# Patient Record
Sex: Male | Born: 1995
Health system: Southern US, Community
[De-identification: ages and names within clinical notes are randomized; demographics above are authoritative.]

## PROBLEM LIST (undated history)

## (undated) DIAGNOSIS — R091 Pleurisy: Secondary | ICD-10-CM

---

## 2014-03-08 ENCOUNTER — Encounter (HOSPITAL_BASED_OUTPATIENT_CLINIC_OR_DEPARTMENT_OTHER): Payer: Self-pay | Admitting: Emergency Medicine

## 2014-03-08 ENCOUNTER — Emergency Department (HOSPITAL_BASED_OUTPATIENT_CLINIC_OR_DEPARTMENT_OTHER)
Admission: EM | Admit: 2014-03-08 | Discharge: 2014-03-08 | Disposition: A | Discharge status: Home / Self Care | Payer: Self-pay | Attending: Emergency Medicine | Admitting: Emergency Medicine

## 2014-03-08 ENCOUNTER — Emergency Department (HOSPITAL_BASED_OUTPATIENT_CLINIC_OR_DEPARTMENT_OTHER): Payer: Self-pay

## 2014-03-08 DIAGNOSIS — R091 Pleurisy: Secondary | ICD-10-CM | POA: Insufficient documentation

## 2014-03-08 LAB — TROPONIN I: Troponin I: <0.03 ng/mL (ref <0.031)

## 2014-03-08 MED ORDER — IBUPROFEN 600 MG PO TABS: 600.0000 mg | ORAL_TABLET | Freq: Four times a day (QID) | ORAL | Status: DC | PRN

## 2014-03-08 NOTE — ED Notes (Addendum)
Non specific intermittent chest pain for over two months.  Some sob today. Along with diaphoresis and lightheadedness.  No recent travel.  No acute distress noted.

## 2014-03-08 NOTE — Discharge Instructions (Signed)

## 2014-03-08 NOTE — ED Notes (Signed)
Pt c/o chest pain x 2 months- HR 75, O2 sats 100% on RA checked by Eliseo GumBenton, RN- [facility]

## 2014-03-08 NOTE — ED Provider Notes (Signed)
CSN: [id number]     Arrival date & time 03/08/14  1521 History   First MD Initiated Contact with Patient 03/08/14 1536     Chief Complaint  Patient presents with  . Chest Pain      HPI Patient comes in with 2 month history of intermittent chest pain.  Chest pain has been pleuritic in nature.  Comes and goes.  No diaphoresis, fever, chills.  No recent travel.  No leg swelling.  Patient at work when pain was there today. No past medical history on file. No past surgical history on file. No family history on file. History  Substance Use Topics  . Smoking status: Never Smoker   . Smokeless tobacco: Not on file  . Alcohol Use: Not on file    Review of Systems  All other systems reviewed and are negative  Allergies  Review of patient's allergies indicates no known allergies.  Home Medications   Prior to Admission medications   Medication Sig Start Date End Date Taking? Authorizing Provider  ibuprofen (ADVIL,MOTRIN) 600 MG tablet Take 1 tablet (600 mg total) by mouth every 6 (six) hours as needed. 03/08/14   Nelia Shiobert L Alis Sawchuk, MD   BP 147/79 mmHg  Pulse 73  Temp(Src) 98.5 F (36.9 C) (Oral)  Resp 18  Ht 5\' 8"  (1.727 m)  Wt 175 lb (79.379 kg)  BMI 26.61 kg/m2  SpO2 100% Physical Exam Physical Exam  Nursing note and vitals reviewed. Constitutional: He is oriented to person, place, and time. He appears well-developed and well-nourished. No distress.  HENT:  Head: Normocephalic and atraumatic.  Eyes: Pupils are equal, round, and reactive to light.  Neck: Normal range of motion.  Cardiovascular: Normal rate and intact distal pulses.   Pulmonary/Chest: No respiratory distress.  Abdominal: Normal appearance. He exhibits no distension.  Musculoskeletal: Normal range of motion.  Neurological: He is alert and oriented to person, place, and time. No cranial nerve deficit.  Skin: Skin is warm and dry. No rash noted.  Psychiatric: He has a normal mood and affect. His behavior is  normal.   ED Course  Procedures (including critical care time) Labs Review Labs Reviewed  TROPONIN I    Imaging Review Dg Chest 2 View  03/08/2014   CLINICAL DATA:  Chest pain for approximately 2 months  EXAM: CHEST  2 VIEW  COMPARISON:  None.  FINDINGS: Lungs are clear. Heart size and pulmonary vascularity are normal. No adenopathy. No bone lesions. There is thoracolumbar dextroscoliosis.  IMPRESSION: No edema or consolidation.   Electronically Signed   By: Bretta BangWilliam  Woodruff M.D.   On: 03/08/2014 15:50     EKG Interpretation   Date/Time:  Wednesday March 08 2014 15:27:27 EST Ventricular Rate:  78 PR Interval:  168 QRS Duration: 90 QT Interval:  374 QTC Calculation: 426 R Axis:   24 Text Interpretation:  Normal sinus rhythm with sinus arrhythmia Normal ECG  Confirmed by Radford PaxBEATON  MD, Lovell Nuttall ([id number]) on 03/08/2014 4:05:11 PM      MDM   Final diagnoses:  Pleurisy        Nelia Shiobert L Lindell Renfrew, MD 03/08/14 [id number]

## 2014-10-13 ENCOUNTER — Encounter (HOSPITAL_BASED_OUTPATIENT_CLINIC_OR_DEPARTMENT_OTHER): Payer: Self-pay | Admitting: *Deleted

## 2014-10-13 ENCOUNTER — Emergency Department (HOSPITAL_BASED_OUTPATIENT_CLINIC_OR_DEPARTMENT_OTHER)
Admission: EM | Admit: 2014-10-13 | Discharge: 2014-10-13 | Disposition: A | Discharge status: Home / Self Care | Payer: Self-pay | Attending: Emergency Medicine | Admitting: Emergency Medicine

## 2014-10-13 DIAGNOSIS — R110 Nausea: Secondary | ICD-10-CM | POA: Insufficient documentation

## 2014-10-13 DIAGNOSIS — R42 Dizziness and giddiness: Secondary | ICD-10-CM | POA: Insufficient documentation

## 2014-10-13 MED ORDER — MECLIZINE HCL 25 MG PO TABS: 25.0000 mg | ORAL_TABLET | Freq: Three times a day (TID) | ORAL | Status: DC | PRN

## 2014-10-13 MED ORDER — MECLIZINE HCL 25 MG PO TABS
25.0000 mg | ORAL_TABLET | Freq: Once | ORAL | Status: AC
  Administered 2014-10-13: 25 mg via ORAL
  Filled 2014-10-13: qty 1

## 2014-10-13 NOTE — Discharge Instructions (Signed)

## 2014-10-13 NOTE — ED Notes (Signed)
Pt amb to triage with quick steady gait in nad. Pt reports nausea and intermittent dizzy feeling x 3 days. Pt states 'I thought I was fine, but then I looked on google and got scared!" pt denies any fevers, vomiting or diarrhea, last bm just pta, normal.

## 2014-10-13 NOTE — ED Provider Notes (Signed)
CSN: [id number]     Arrival date & time 10/13/14  1109 History   First MD Initiated Contact with Patient 10/13/14 1138     Chief Complaint  Patient presents with  . Abdominal Pain     (Consider location/radiation/quality/duration/timing/severity/associated sxs/prior Treatment) HPI Comments: Patient presents with dizziness. He states over the last 3 days he's had a spinning sensation. He states it's not related to head movement. It's not related to standing. He's had some associated nausea but no vomiting. He states it's fairly mild right now. He denies any headache. He denies any neck pain. He denies any abdominal pain. He denies any URI symptoms. He denies any numbness or weakness to his extremities. He denies any recent head injuries.  Patient is a 19 y.o. male presenting with abdominal pain.  Abdominal Pain Associated symptoms: nausea   Associated symptoms: no chest pain, no chills, no cough, no diarrhea, no fatigue, no fever, no hematuria, no shortness of breath and no vomiting     History reviewed. No pertinent past medical history. History reviewed. No pertinent past surgical history. History reviewed. No pertinent family history. History  Substance Use Topics  . Smoking status: Never Smoker   . Smokeless tobacco: Not on file  . Alcohol Use: Not on file    Review of Systems  Constitutional: Negative for fever, chills, diaphoresis and fatigue.  HENT: Negative for congestion, rhinorrhea and sneezing.   Eyes: Negative.   Respiratory: Negative for cough, chest tightness and shortness of breath.   Cardiovascular: Negative for chest pain and leg swelling.  Gastrointestinal: Positive for nausea. Negative for vomiting, abdominal pain, diarrhea and blood in stool.  Genitourinary: Negative for frequency, hematuria, flank pain and difficulty urinating.  Musculoskeletal: Negative for back pain and arthralgias.  Skin: Negative for rash.  Neurological: Positive for dizziness. Negative  for speech difficulty, weakness, numbness and headaches.      Allergies  Review of patient's allergies indicates no known allergies.  Home Medications   Prior to Admission medications   Medication Sig Start Date End Date Taking? Authorizing Provider  ibuprofen (ADVIL,MOTRIN) 600 MG tablet Take 1 tablet (600 mg total) by mouth every 6 (six) hours as needed. 03/08/14   Nelva Nay, MD  meclizine (ANTIVERT) 25 MG tablet Take 1 tablet (25 mg total) by mouth 3 (three) times daily as needed for dizziness. 10/13/14   Rolan Bucco, MD   BP 128/77 mmHg  Pulse 107  Temp(Src) 98 F (36.7 C) (Oral)  Resp 18  Ht  (1.727 m)  Wt 176 lb (79.833 kg)  BMI 26.77 kg/m2  SpO2 99% Physical Exam  Constitutional: He is oriented to person, place, and time. He appears well-developed and well-nourished.  HENT:  Head: Normocephalic and atraumatic.  Eyes: Pupils are equal, round, and reactive to light.  Positive horizontal nystagmus, no vertical or rotational nystagmus  Neck: Normal range of motion. Neck supple.  Cardiovascular: Normal rate, regular rhythm and normal heart sounds.   Pulmonary/Chest: Effort normal and breath sounds normal. No respiratory distress. He has no wheezes. He has no rales. He exhibits no tenderness.  Abdominal: Soft. Bowel sounds are normal. There is no tenderness. There is no rebound and no guarding.  Musculoskeletal: Normal range of motion. He exhibits no edema.  Lymphadenopathy:    He has no cervical adenopathy.  Neurological: He is alert and oriented to person, place, and time.  Motor 5 out of 5 all extraneous, sensation grossly intact to light touch all extremities, finger-nose intact,  no pronator drift, gait normal. Cranial nerves II visual grossly intact.  Skin: Skin is warm and dry. No rash noted.  Psychiatric: He has a normal mood and affect.    ED Course  Procedures (including critical care time) Labs Review Labs Reviewed - No data to display  Imaging  Review No results found.   EKG Interpretation None      MDM   Final diagnoses:  Vertigo    Patient presents with vertigo symptoms. He has no ataxia, he has no other suggestions of the central etiology for the vertigo. He has no headache or neck pain which would be more suspicious for a carotid dissection. He has only very mild symptoms currently. He was discharged with a prescription for meclizine to use for symptomatic relief. He is encouraged to return if he has any worsening symptoms.    Rolan Bucco, MD 10/13/14 1300

## 2014-10-13 NOTE — ED Notes (Signed)
Patient given a gown and asked to undress and put the gown on opening in the back, Patient given a cup to obtain a urine sample if he gets the urge to use the restroom.

## 2016-12-08 ENCOUNTER — Emergency Department (HOSPITAL_BASED_OUTPATIENT_CLINIC_OR_DEPARTMENT_OTHER): Payer: 59

## 2016-12-08 ENCOUNTER — Encounter (HOSPITAL_BASED_OUTPATIENT_CLINIC_OR_DEPARTMENT_OTHER): Payer: Self-pay

## 2016-12-08 ENCOUNTER — Emergency Department (HOSPITAL_BASED_OUTPATIENT_CLINIC_OR_DEPARTMENT_OTHER)
Admission: EM | Admit: 2016-12-08 | Discharge: 2016-12-08 | Disposition: A | Discharge status: Home / Self Care | Payer: 59 | Attending: Emergency Medicine | Admitting: Emergency Medicine

## 2016-12-08 DIAGNOSIS — J039 Acute tonsillitis, unspecified: Secondary | ICD-10-CM | POA: Diagnosis not present

## 2016-12-08 DIAGNOSIS — J029 Acute pharyngitis, unspecified: Secondary | ICD-10-CM | POA: Diagnosis present

## 2016-12-08 HISTORY — DX: Pleurisy: R09.1

## 2016-12-08 LAB — RAPID STREP SCREEN (MED CTR MEBANE ONLY): Streptococcus, Group A Screen (Direct): NEGATIVE

## 2016-12-08 MED ORDER — DEXAMETHASONE 4 MG PO TABS
8.0000 mg | ORAL_TABLET | Freq: Once | ORAL | Status: AC
  Administered 2016-12-08: 8 mg via ORAL
  Filled 2016-12-08: qty 2

## 2016-12-08 MED ORDER — AMOXICILLIN-POT CLAVULANATE 875-125 MG PO TABS: 1.0000 | ORAL_TABLET | Freq: Two times a day (BID) | ORAL | 0 refills | Status: AC

## 2016-12-08 MED ORDER — IBUPROFEN 400 MG PO TABS
600.0000 mg | ORAL_TABLET | Freq: Once | ORAL | Status: AC
  Administered 2016-12-08: 600 mg via ORAL
  Filled 2016-12-08: qty 1

## 2016-12-08 MED FILL — AMOX-CLAV 875-125 MG TABLET: 875-125 | 10 days supply | Qty: 20 | Fill #0

## 2016-12-08 NOTE — ED Triage Notes (Signed)
C/o CP started yesterday-states feels same as we had pleurisy-denies cough-HA, chills, sore throat started today-NAD-steady gait

## 2016-12-08 NOTE — Discharge Instructions (Signed)
Please read instructions below.  You can take advil avery 6 hours as needed for sore throat or body aches. Begin taking the antibiotic 2 times per day until it is gone.  Drink plenty of water.  Follow up with your primary care provider in 3 days.  Return to the ER for worsening symptoms, difficulty swallowing liquids, difficulty breathing, or new or worsening symptoms.

## 2016-12-08 NOTE — ED Provider Notes (Signed)
[facility]   CSN: [id number] Arrival date & time: 12/08/16  1317     History   Chief Complaint Chief Complaint  Patient presents with  . Multiple c/o    HPI Darrell Webb is a 21 y.o. male with past medical history of pleurisy, presenting to the ED for acute onset of sore throat that began today. He states throat is worse with swallowing, no medications tried for his symptoms. He also reports associated epigastric/central chest discomfort that is worse with particular movements and generalized body aches. He denies cough, shortness of breath, difficulty breathing or swallowing, congestion or ear pain.  The history is provided by the patient.    Past Medical History:  Diagnosis Date  . Pleurisy     There are no active problems to display for this patient.   History reviewed. No pertinent surgical history.     Home Medications    Prior to Admission medications   Medication Sig Start Date End Date Taking? Authorizing Provider  amoxicillin-clavulanate (AUGMENTIN) 875-125 MG tablet Take 1 tablet by mouth every 12 (twelve) hours. 12/08/16 12/18/16  Russo, [place] N, PA-C    Family History No family history on file.  Social History Social History  Substance Use Topics  . Smoking status: Never Smoker  . Smokeless tobacco: Never Used  . Alcohol use Yes     Comment: occ     Allergies   Patient has no known allergies.   Review of Systems Review of Systems  Constitutional: Negative for chills and fever.  HENT: Positive for sore throat. Negative for congestion, ear pain, trouble swallowing and voice change.   Respiratory: Positive for chest tightness. Negative for cough and shortness of breath.   Gastrointestinal: Negative for nausea.     Physical Exam Updated Vital Signs BP 124/83 (BP Location: Left Arm)   Pulse (!) 104   Temp 100.3 F (37.9 C) (Oral)   Resp 18   Ht  (1.727 m)   Wt 81.6 kg (180 lb)   SpO2 100%   BMI  27.37 kg/m   Physical Exam  Constitutional: He appears well-developed and well-nourished. No distress.  tolerating secretions.  HENT:  Head: Normocephalic and atraumatic.  Right Ear: Hearing, external ear and ear canal normal. Tympanic membrane is erythematous.  Left Ear: Hearing, external ear and ear canal normal. Tympanic membrane is erythematous.  Nose: Nose normal.  Mouth/Throat: Uvula is midline. No trismus in the jaw. No uvula swelling. Posterior oropharyngeal erythema present.  Uvula is midline with symmetrical raise in soft palate. Right tonsil with erythema and edema, greater than left. Small amount of tonsillar exudate.  Eyes: Pupils are equal, round, and reactive to light. Conjunctivae and EOM are normal.  Neck: Normal range of motion. Neck supple. No tracheal deviation present.  Cardiovascular: Regular rhythm, normal heart sounds and intact distal pulses.   Mildly tachycardic  Pulmonary/Chest: Effort normal and breath sounds normal. No stridor. No respiratory distress. He has no wheezes. He has no rales. He exhibits tenderness.  Abdominal: Soft. Bowel sounds are normal. He exhibits no distension. There is no tenderness. There is no rebound and no guarding.  Lymphadenopathy:    He has no cervical adenopathy.  Psychiatric: He has a normal mood and affect. His behavior is normal.  Nursing note and vitals reviewed.    ED Treatments / Results  Labs (all labs ordered are listed, but only abnormal results are displayed) Labs Reviewed  RAPID STREP SCREEN (NOT AT [facility])  CULTURE, GROUP A STREP [facility])    EKG  EKG Interpretation None       Radiology Dg Chest 2 View  Result Date: 12/08/2016 CLINICAL DATA:  Headache and shortness of breath. EXAM: CHEST  2 VIEW COMPARISON:  Chest radiograph 03/08/2014. FINDINGS: Stable cardiac and mediastinal contours. No consolidative pulmonary opacities. No pleural effusion or pneumothorax. Regional skeleton is unremarkable. IMPRESSION: No  acute cardiopulmonary process. Electronically Signed   By: Annia Belt M.D.   On: 12/08/2016 14:22    Procedures Procedures (including critical care time)  Medications Ordered in ED Medications  ibuprofen (ADVIL,MOTRIN) tablet 600 mg (600 mg Oral Given 12/08/16 1425)  dexamethasone (DECADRON) tablet 8 mg (8 mg Oral Given 12/08/16 1446)     Initial Impression / Assessment and Plan / ED Course  I have reviewed the triage vital signs and the nursing notes.  Pertinent labs & imaging results that were available during my care of the patient were reviewed by me and considered in my medical decision making (see chart for details).     Patients symptoms are consistent with tonsillitis, right greater than left. Patient is afebrile, tolerating secretions. Presentation not consistent with PTA or infxn spread to soft tissue. No difficulty breathing or swallowing. No trismus or uvula deviation. CXR neg, lungs CTAB. Will discharge w empiric antibiotics and symptomatic treatment. Strep culture sent. Strict return precautions given. Pt is hemodynamically stable and in NAD prior to discharge.  Patient discussed with and seen by Dr. Rubin Payor who guided treatment and agrees with care plan.   Discussed results, findings, treatment and follow up. Patient advised of return precautions. Patient verbalized understanding and agreed with plan.  Final Clinical Impressions(s) / ED Diagnoses   Final diagnoses:  Tonsillitis    New Prescriptions Discharge Medication List as of 12/08/2016  2:57 PM    START taking these medications   Details  amoxicillin-clavulanate (AUGMENTIN) 875-125 MG tablet Take 1 tablet by mouth every 12 (twelve) hours., Starting Mon 12/08/2016, Until Thu 12/18/2016, Print         Timothy Lasso [place] N, PA-C 12/08/16 1511    Benjiman Core, MD 12/09/16 1538

## 2016-12-08 NOTE — ED Notes (Signed)
Patient reports sore throat.

## 2016-12-11 LAB — CULTURE, GROUP A STREP (THRC)

## 2018-04-01 IMAGING — DX DG CHEST 2V
2 series · 2 of 2 positions shown · non-contrast
Comparison: Chest radiograph 03/08/2014.

CLINICAL DATA: Headache and shortness of breath.

EXAM:
CHEST  2 VIEW

[chest pa]
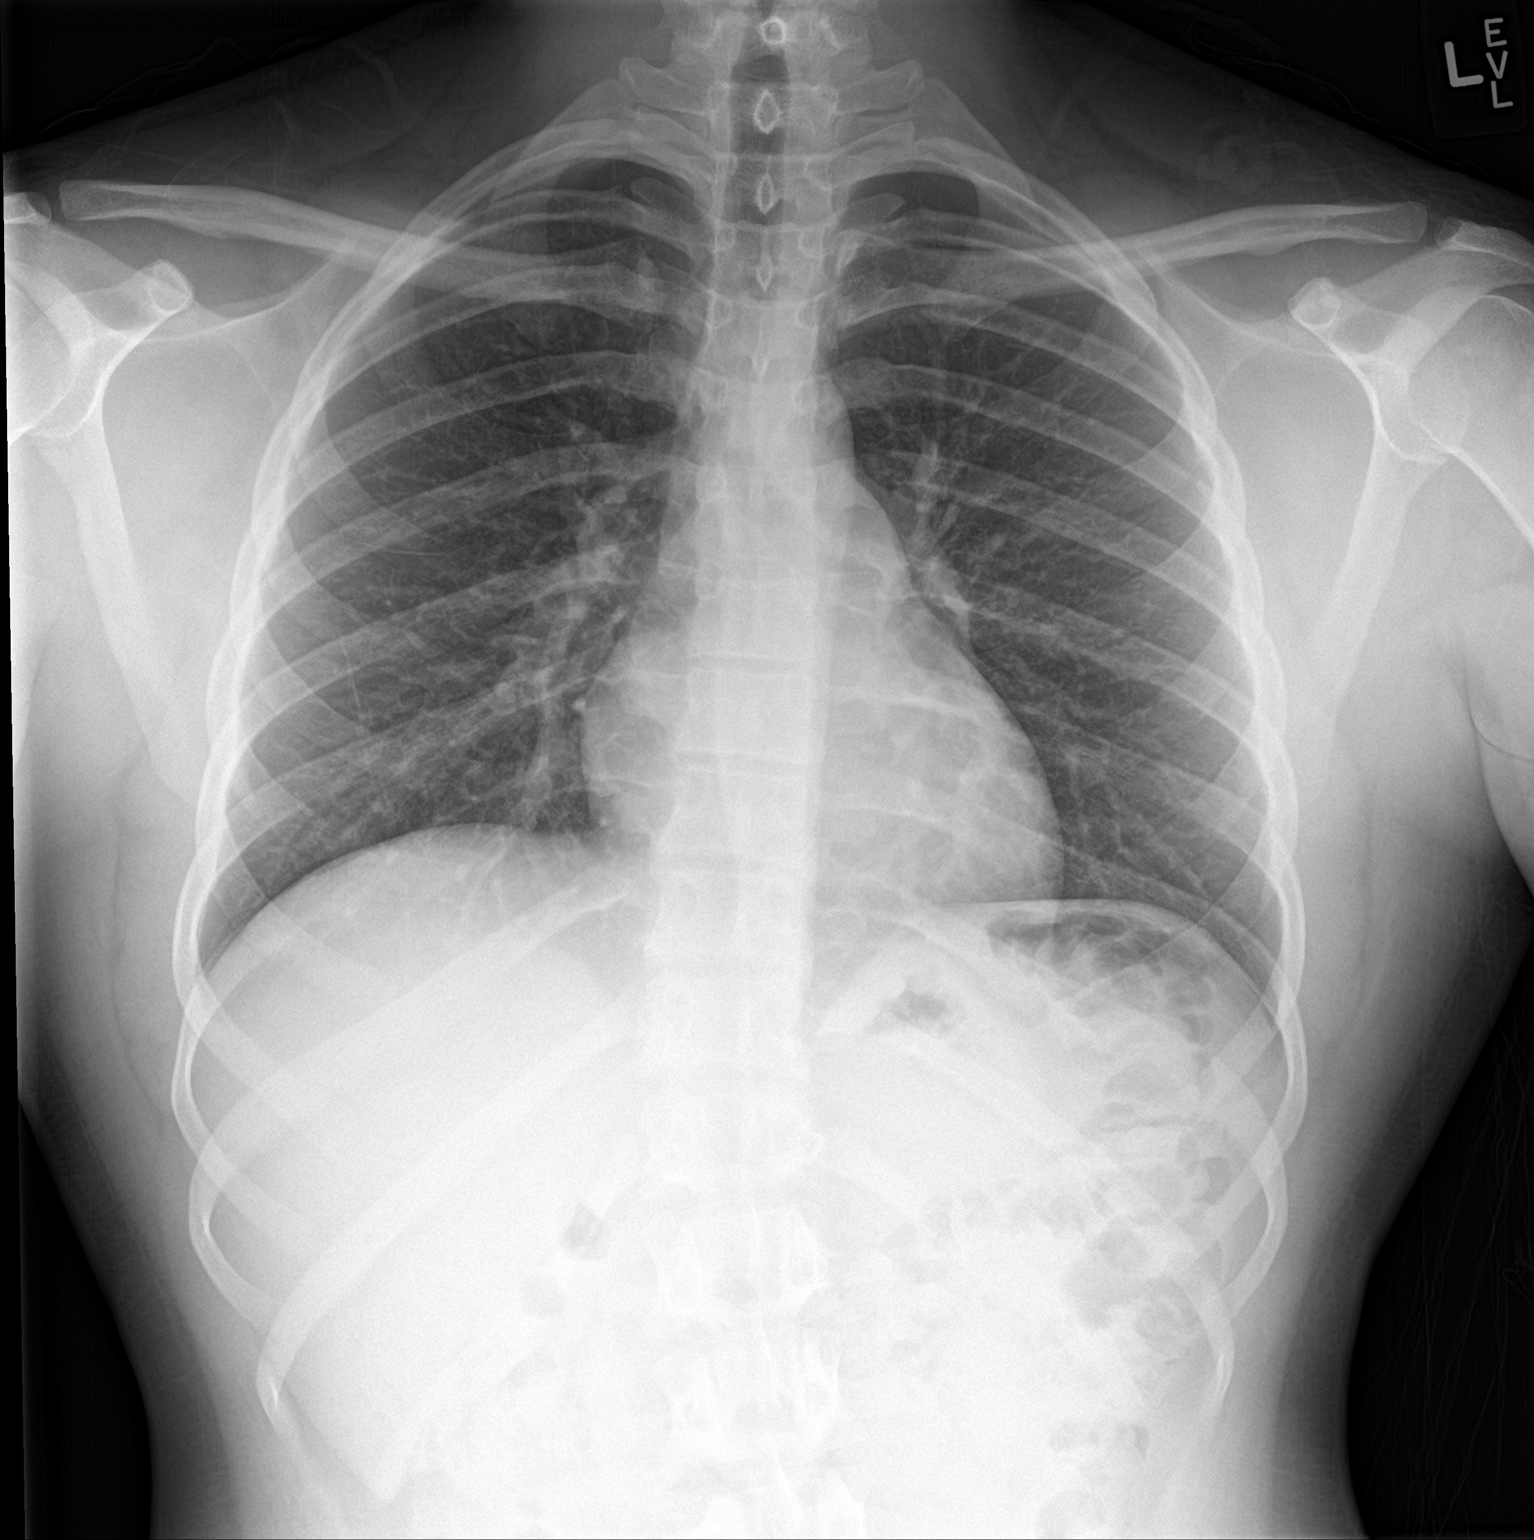

[chest lat]
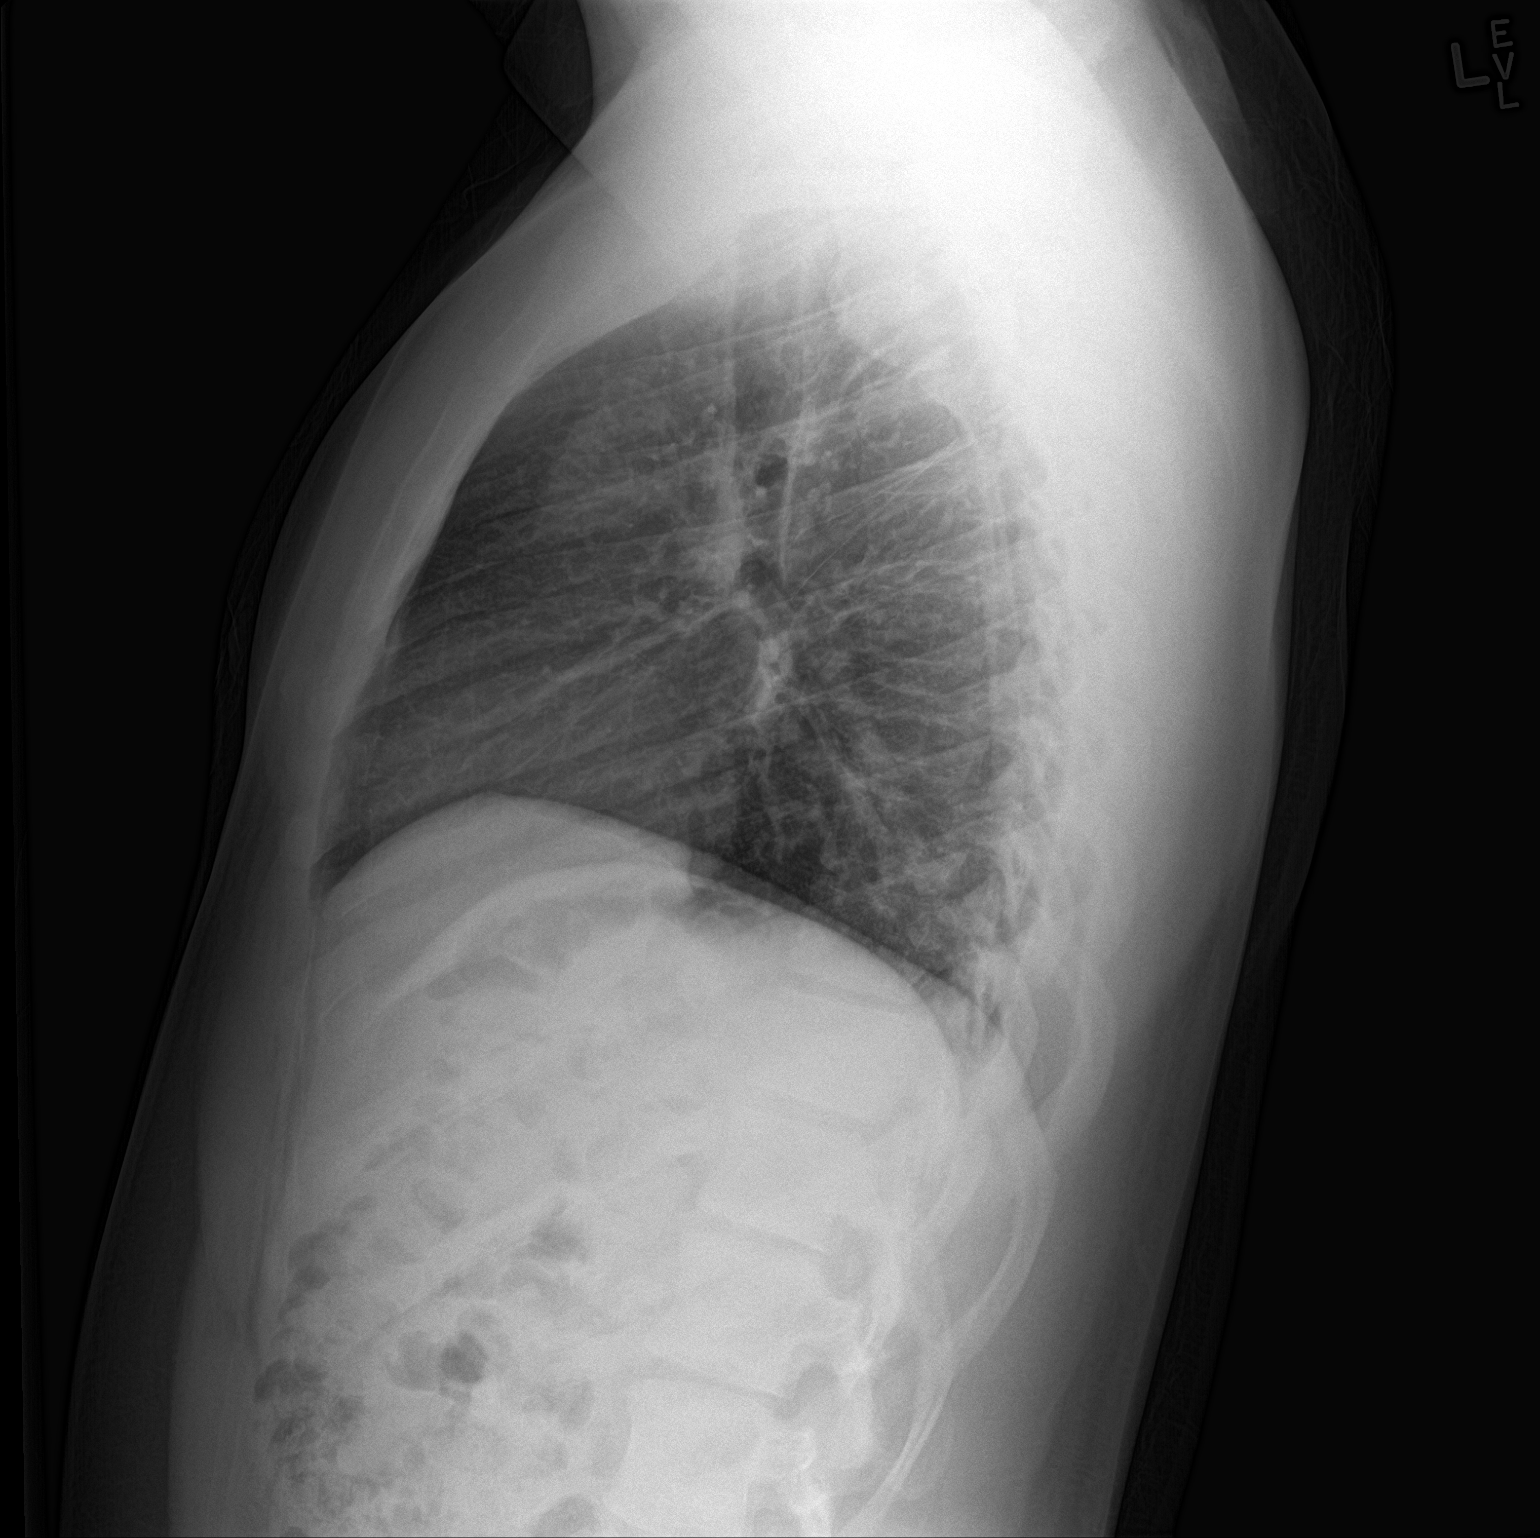

[2 of 2 positions shown; findings below may reference images not displayed]

FINDINGS: Stable cardiac and mediastinal contours. No consolidative pulmonary
opacities. No pleural effusion or pneumothorax. Regional skeleton is
unremarkable.
IMPRESSION: No acute cardiopulmonary process.

## 2019-04-21 ENCOUNTER — Emergency Department (HOSPITAL_BASED_OUTPATIENT_CLINIC_OR_DEPARTMENT_OTHER)
Admission: EM | Admit: 2019-04-21 | Discharge: 2019-04-21 | Disposition: A | Discharge status: Home / Self Care | Payer: BC Managed Care – PPO | Attending: Emergency Medicine | Admitting: Emergency Medicine

## 2019-04-21 ENCOUNTER — Other Ambulatory Visit: Payer: Self-pay

## 2019-04-21 ENCOUNTER — Encounter (HOSPITAL_BASED_OUTPATIENT_CLINIC_OR_DEPARTMENT_OTHER): Payer: Self-pay | Admitting: *Deleted

## 2019-04-21 ENCOUNTER — Emergency Department (HOSPITAL_BASED_OUTPATIENT_CLINIC_OR_DEPARTMENT_OTHER): Payer: BC Managed Care – PPO

## 2019-04-21 DIAGNOSIS — R5383 Other fatigue: Secondary | ICD-10-CM | POA: Diagnosis not present

## 2019-04-21 DIAGNOSIS — R091 Pleurisy: Secondary | ICD-10-CM | POA: Diagnosis not present

## 2019-04-21 DIAGNOSIS — R6883 Chills (without fever): Secondary | ICD-10-CM | POA: Insufficient documentation

## 2019-04-21 DIAGNOSIS — U071 COVID-19: Secondary | ICD-10-CM | POA: Insufficient documentation

## 2019-04-21 DIAGNOSIS — R0789 Other chest pain: Secondary | ICD-10-CM

## 2019-04-21 DIAGNOSIS — R079 Chest pain, unspecified: Secondary | ICD-10-CM | POA: Diagnosis present

## 2019-04-21 LAB — CBC WITH DIFFERENTIAL/PLATELET
Abs Immature Granulocytes: 0.03 10*3/uL (ref 0.00–0.07)
Basophils Absolute: 0 10*3/uL (ref 0.0–0.1)
Basophils Relative: 0 %
Eosinophils Absolute: 0.1 10*3/uL (ref 0.0–0.5)
Eosinophils Relative: 1 %
HCT: 42.1 % (ref 39.0–52.0)
Hemoglobin: 14.8 g/dL (ref 13.0–17.0)
Immature Granulocytes: 0 %
Lymphocytes Relative: 19 %
Lymphs Abs: 1.5 10*3/uL (ref 0.7–4.0)
MCH: 31.8 pg (ref 26.0–34.0)
MCHC: 35.2 g/dL (ref 30.0–36.0)
MCV: 90.3 fL (ref 80.0–100.0)
Monocytes Absolute: 1.2 10*3/uL — ABNORMAL HIGH (ref 0.1–1.0)
Monocytes Relative: 15 %
Neutro Abs: 5.1 10*3/uL (ref 1.7–7.7)
Neutrophils Relative %: 65 %
Platelets: 215 10*3/uL (ref 150–400)
RBC: 4.66 MIL/uL (ref 4.22–5.81)
RDW: 11.9 % (ref 11.5–15.5)
WBC: 8 10*3/uL (ref 4.0–10.5)
nRBC: 0 % (ref 0.0–0.2)

## 2019-04-21 LAB — COMPREHENSIVE METABOLIC PANEL
ALT: 29 U/L (ref 0–44)
AST: 25 U/L (ref 15–41)
Albumin: 4.3 g/dL (ref 3.5–5.0)
Alkaline Phosphatase: 69 U/L (ref 38–126)
Anion gap: 7 (ref 5–15)
BUN: 17 mg/dL (ref 6–20)
CO2: 26 mmol/L (ref 22–32)
Calcium: 9.1 mg/dL (ref 8.9–10.3)
Chloride: 101 mmol/L (ref 98–111)
Creatinine, Ser: 1.02 mg/dL (ref 0.61–1.24)
GFR calc Af Amer: >60 mL/min (ref >60)
GFR calc non Af Amer: >60 mL/min (ref >60)
Glucose, Bld: 91 mg/dL (ref 70–99)
Potassium: 3.7 mmol/L (ref 3.5–5.1)
Sodium: 134 mmol/L — ABNORMAL LOW (ref 135–145)
Total Bilirubin: 0.7 mg/dL (ref 0.3–1.2)
Total Protein: 7.3 g/dL (ref 6.5–8.1)

## 2019-04-21 LAB — CK: Total CK: 270 U/L (ref 49–397)

## 2019-04-21 LAB — TROPONIN I (HIGH SENSITIVITY): Troponin I (High Sensitivity): 5 ng/L (ref <18)

## 2019-04-21 MED ORDER — ACETAMINOPHEN 500 MG PO TABS
1000.0000 mg | ORAL_TABLET | Freq: Once | ORAL | Status: AC
Start: 2019-04-21 — End: 2019-04-21
  Administered 2019-04-21: 1000 mg via ORAL
  Filled 2019-04-21: qty 2

## 2019-04-21 MED ORDER — SODIUM CHLORIDE 0.9 % IV BOLUS
1000.0000 mL | Freq: Once | INTRAVENOUS | Status: AC
  Administered 2019-04-21: 1000 mL via INTRAVENOUS

## 2019-04-21 NOTE — Discharge Instructions (Signed)
Take tylenol or motrin for pain and fever   See your doctor   Return to ER if you have worse chest pain, shortness of breath.   You were tested for COVID. Stay home until result comes back     Person Under Monitoring Name: Darrell Webb  Location: [address] [place] [address]   Infection Prevention Recommendations for Individuals Confirmed to have, or Being Evaluated for, 2019 Novel Coronavirus (COVID-19) Infection Who Receive Care at Home  Individuals who are confirmed to have, or are being evaluated for, COVID-19 should follow the prevention steps below until a healthcare provider or local or state health department says they can return to normal activities.  Stay home except to get medical care You should restrict activities outside your home, except for getting medical care. Do not go to work, school, or public areas, and do not use public transportation or taxis.  Call ahead before visiting your doctor Before your medical appointment, call the healthcare provider and tell them that you have, or are being evaluated for, COVID-19 infection. This will help the healthcare provider's office take steps to keep other people from getting infected. Ask your healthcare provider to call the local or state health department.  Monitor your symptoms Seek prompt medical attention if your illness is worsening (e.g., difficulty breathing). Before going to your medical appointment, call the healthcare provider and tell them that you have, or are being evaluated for, COVID-19 infection. Ask your healthcare provider to call the local or state health department.  Wear a facemask You should wear a facemask that covers your nose and mouth when you are in the same room with other people and when you visit a healthcare provider. People who live with or visit you should also wear a facemask while they are in the same room with you.  Separate yourself from other people in your  home As much as possible, you should stay in a different room from other people in your home. Also, you should use a separate bathroom, if available.  Avoid sharing household items You should not share dishes, drinking glasses, cups, eating utensils, towels, bedding, or other items with other people in your home. After using these items, you should wash them thoroughly with soap and water.  Cover your coughs and sneezes Cover your mouth and nose with a tissue when you cough or sneeze, or you can cough or sneeze into your sleeve. Throw used tissues in a lined trash can, and immediately wash your hands with soap and water for at least 20 seconds or use an alcohol-based hand rub.  Wash your [employer] your hands often and thoroughly with soap and water for at least 20 seconds. You can use an alcohol-based hand sanitizer if soap and water are not available and if your hands are not visibly dirty. Avoid touching your eyes, nose, and mouth with unwashed hands.   Prevention Steps for Caregivers and Household Members of Individuals Confirmed to have, or Being Evaluated for, COVID-19 Infection Being Cared for in the Home  If you live with, or provide care at home for, a person confirmed to have, or being evaluated for, COVID-19 infection please follow these guidelines to prevent infection:  Follow healthcare provider's instructions Make sure that you understand and can help the patient follow any healthcare provider instructions for all care.  Provide for the patient's basic needs You should help the patient with basic needs in the home and provide support for getting groceries,  prescriptions, and other personal needs.  Monitor the patient's symptoms If they are getting sicker, call his or her medical provider and tell them that the patient has, or is being evaluated for, COVID-19 infection. This will help the healthcare provider's office take steps to keep other people from getting  infected. Ask the healthcare provider to call the local or state health department.  Limit the number of people who have contact with the patient If possible, have only one caregiver for the patient. Other household members should stay in another home or place of residence. If this is not possible, they should stay in another room, or be separated from the patient as much as possible. Use a separate bathroom, if available. Restrict visitors who do not have an essential need to be in the home.  Keep older adults, very young children, and other sick people away from the patient Keep older adults, very young children, and those who have compromised immune systems or chronic health conditions away from the patient. This includes people with chronic heart, lung, or kidney conditions, diabetes, and cancer.  Ensure good ventilation Make sure that shared spaces in the home have good air flow, such as from an air conditioner or an opened window, weather permitting.  Wash your hands often Wash your hands often and thoroughly with soap and water for at least 20 seconds. You can use an alcohol based hand sanitizer if soap and water are not available and if your hands are not visibly dirty. Avoid touching your eyes, nose, and mouth with unwashed hands. Use disposable paper towels to dry your hands. If not available, use dedicated cloth towels and replace them when they become wet.  Wear a facemask and gloves Wear a disposable facemask at all times in the room and gloves when you touch or have contact with the patient's blood, body fluids, and/or secretions or excretions, such as sweat, saliva, sputum, nasal mucus, vomit, urine, or feces.  Ensure the mask fits over your nose and mouth tightly, and do not touch it during use. Throw out disposable facemasks and gloves after using them. Do not reuse. Wash your hands immediately after removing your facemask and gloves. If your personal clothing becomes  contaminated, carefully remove clothing and launder. Wash your hands after handling contaminated clothing. Place all used disposable facemasks, gloves, and other waste in a lined container before disposing them with other household waste. Remove gloves and wash your hands immediately after handling these items.  Do not share dishes, glasses, or other household items with the patient Avoid sharing household items. You should not share dishes, drinking glasses, cups, eating utensils, towels, bedding, or other items with a patient who is confirmed to have, or being evaluated for, COVID-19 infection. After the person uses these items, you should wash them thoroughly with soap and water.  Wash laundry thoroughly Immediately remove and wash clothes or bedding that have blood, body fluids, and/or secretions or excretions, such as sweat, saliva, sputum, nasal mucus, vomit, urine, or feces, on them. Wear gloves when handling laundry from the patient. Read and follow directions on labels of laundry or clothing items and detergent. In general, wash and dry with the warmest temperatures recommended on the label.  Clean all areas the individual has used often Clean all touchable surfaces, such as counters, tabletops, doorknobs, bathroom fixtures, toilets, phones, keyboards, tablets, and bedside tables, every day. Also, clean any surfaces that may have blood, body fluids, and/or secretions or excretions on them. Wear gloves  when cleaning surfaces the patient has come in contact with. Use a diluted bleach solution (e.g., dilute bleach with 1 part bleach and 10 parts water) or a household disinfectant with a label that says EPA-registered for coronaviruses. To make a bleach solution at home, add 1 tablespoon of bleach to 1 quart (4 cups) of water. For a larger supply, add  cup of bleach to 1 gallon (16 cups) of water. Read labels of cleaning products and follow recommendations provided on product labels. Labels  contain instructions for safe and effective use of the cleaning product including precautions you should take when applying the product, such as wearing gloves or eye protection and making sure you have good ventilation during use of the product. Remove gloves and wash hands immediately after cleaning.  Monitor yourself for signs and symptoms of illness Caregivers and household members are considered close contacts, should monitor their health, and will be asked to limit movement outside of the home to the extent possible. Follow the monitoring steps for close contacts listed on the symptom monitoring form.   ? If you have additional questions, contact your local health department or call the epidemiologist on call at [phone] (available 24/7). ? This guidance is subject to change. For the most up-to-date guidance from [facility], please refer to their website: YouBlogs.pl

## 2019-04-21 NOTE — ED Triage Notes (Signed)
Pt c/o mid sternal chest pain with deep breathing , fatigue and SOB

## 2019-04-21 NOTE — ED Provider Notes (Signed)
[name] Provider Note   CSN: [id number] Arrival date & time: 04/21/19  1759     History Chief Complaint  Patient presents with  . Chest Pain    Darrell Webb is a 24 y.o. male hx of pleurisy here presenting with shortness of breath and chest pain and fatigue and chills.  Patient states that since yesterday, he has been having some chest pain and cough.  He states that he feels tired and has some chills as well.  He denies any leg swelling or history of recent travel or history of blood clots .  Patient denies any close contact with Covid.  Patient states that he had similar symptoms and was diagnosed with pleurisy previously.  Patient is otherwise healthy.  The history is provided by the patient.       Past Medical History:  Diagnosis Date  . Pleurisy     There are no problems to display for this patient.   History reviewed. No pertinent surgical history.     History reviewed. No pertinent family history.  Social History   Tobacco Use  . Smoking status: Never Smoker  . Smokeless tobacco: Never Used  Substance Use Topics  . Alcohol use: Yes    Comment: occ  . Drug use: No    Home Medications Prior to Admission medications   Not on File    Allergies    Patient has no known allergies.  Review of Systems   Review of Systems  Cardiovascular: Positive for chest pain.  All other systems reviewed and are negative.   Physical Exam Updated Vital Signs BP 122/82   Pulse 82   Temp 99.8 F (37.7 C)   Resp (!) 21   Ht 5\' 8"  (1.727 m)   Wt 87.5 kg   SpO2 100%   BMI 29.35 kg/m   Physical Exam Vitals and nursing note reviewed.  Constitutional:      Comments: Slightly uncomfortable   HENT:     Head: Normocephalic.  Eyes:     Pupils: Pupils are equal, round, and reactive to light.  Cardiovascular:     Rate and Rhythm: Normal rate and regular rhythm.     Heart sounds: Normal heart sounds.  Pulmonary:     Effort:  Pulmonary effort is normal.     Breath sounds: Normal breath sounds.  Abdominal:     General: Bowel sounds are normal.     Palpations: Abdomen is soft.  Musculoskeletal:        General: Normal range of motion.     Cervical back: Normal range of motion.  Skin:    General: Skin is warm.     Capillary Refill: Capillary refill takes less than 2 seconds.  Neurological:     General: No focal deficit present.     Mental Status: He is oriented to person, place, and time.  Psychiatric:        Mood and Affect: Mood normal.        Behavior: Behavior normal.     ED Results / Procedures / Treatments   Labs (all labs ordered are listed, but only abnormal results are displayed) Labs Reviewed  CBC WITH DIFFERENTIAL/PLATELET - Abnormal; Notable for the following components:      Result Value   Monocytes Absolute 1.2 (*)    All other components within normal limits  COMPREHENSIVE METABOLIC PANEL - Abnormal; Notable for the following components:   Sodium 134 (*)    All other components  within normal limits  NOVEL CORONAVIRUS, NAA (HOSP ORDER, SEND-OUT TO REF LAB; TAT 18-24 HRS)  CK  TROPONIN I (HIGH SENSITIVITY)    EKG EKG Interpretation  Date/Time:  Thursday April 21 2019 18:07:08 EST Ventricular Rate:  93 PR Interval:    QRS Duration: 81 QT Interval:  334 QTC Calculation: 416 R Axis:   1 Text Interpretation: Sinus rhythm LVH by voltage No significant change since last tracing Confirmed by Richardean Canal [phone]) on 04/21/2019 6:13:33 PM   Radiology DG Chest Port 1 View  Result Date: 04/21/2019 CLINICAL DATA:  Cough and chest pain. EXAM: PORTABLE CHEST 1 VIEW COMPARISON:  Radiograph 12/08/2016 FINDINGS: The cardiomediastinal contours are normal. The lungs are clear. Pulmonary vasculature is normal. No consolidation, pleural effusion, or pneumothorax. No acute osseous abnormalities are seen. IMPRESSION: No acute chest findings. Electronically Signed   By: Narda Rutherford M.D.   On:  04/21/2019 19:22    Procedures Procedures (including critical care time)  Medications Ordered in ED Medications  sodium chloride 0.9 % bolus 1,000 mL (1,000 mLs Intravenous New Bag/Given 04/21/19 1842)  acetaminophen (TYLENOL) tablet 1,000 mg (1,000 mg Oral Given 04/21/19 1831)    ED Course  I have reviewed the triage vital signs and the nursing notes.  Pertinent labs & imaging results that were available during my care of the patient were reviewed by me and considered in my medical decision making (see chart for details).    MDM Rules/Calculators/A&P                      Darrell Webb is a 24 y.o. male here with chills and chest pain and fatigue. Patient has low-grade temperature in the ED. Consider viral syndrome versus pneumonia versus Covid. Low suspicion for ACS and symptoms for 24 hours so trop x 1 sufficient. I doubt PE. Will get labs, trop x 1, CXR, COVID.   7:39 PM Labs are unremarkable and chest x-ray is clear. Troponin is negative x1. Covid test is sent and told him to stay home until Covid test results.  Final Clinical Impression(s) / ED Diagnoses Final diagnoses:  None    Rx / DC Orders ED Discharge Orders    None       Charlynne Pander, MD 04/21/19 1940

## 2019-04-22 LAB — NOVEL CORONAVIRUS, NAA (HOSP ORDER, SEND-OUT TO REF LAB; TAT 18-24 HRS): SARS-CoV-2, NAA: DETECTED — AB

## 2020-08-12 IMAGING — DX DG CHEST 1V PORT
1 series · 1 of 1 positions shown · non-contrast
Comparison: Radiograph 12/08/2016

CLINICAL DATA: Cough and chest pain.

EXAM:
PORTABLE CHEST 1 VIEW

[chest ap]
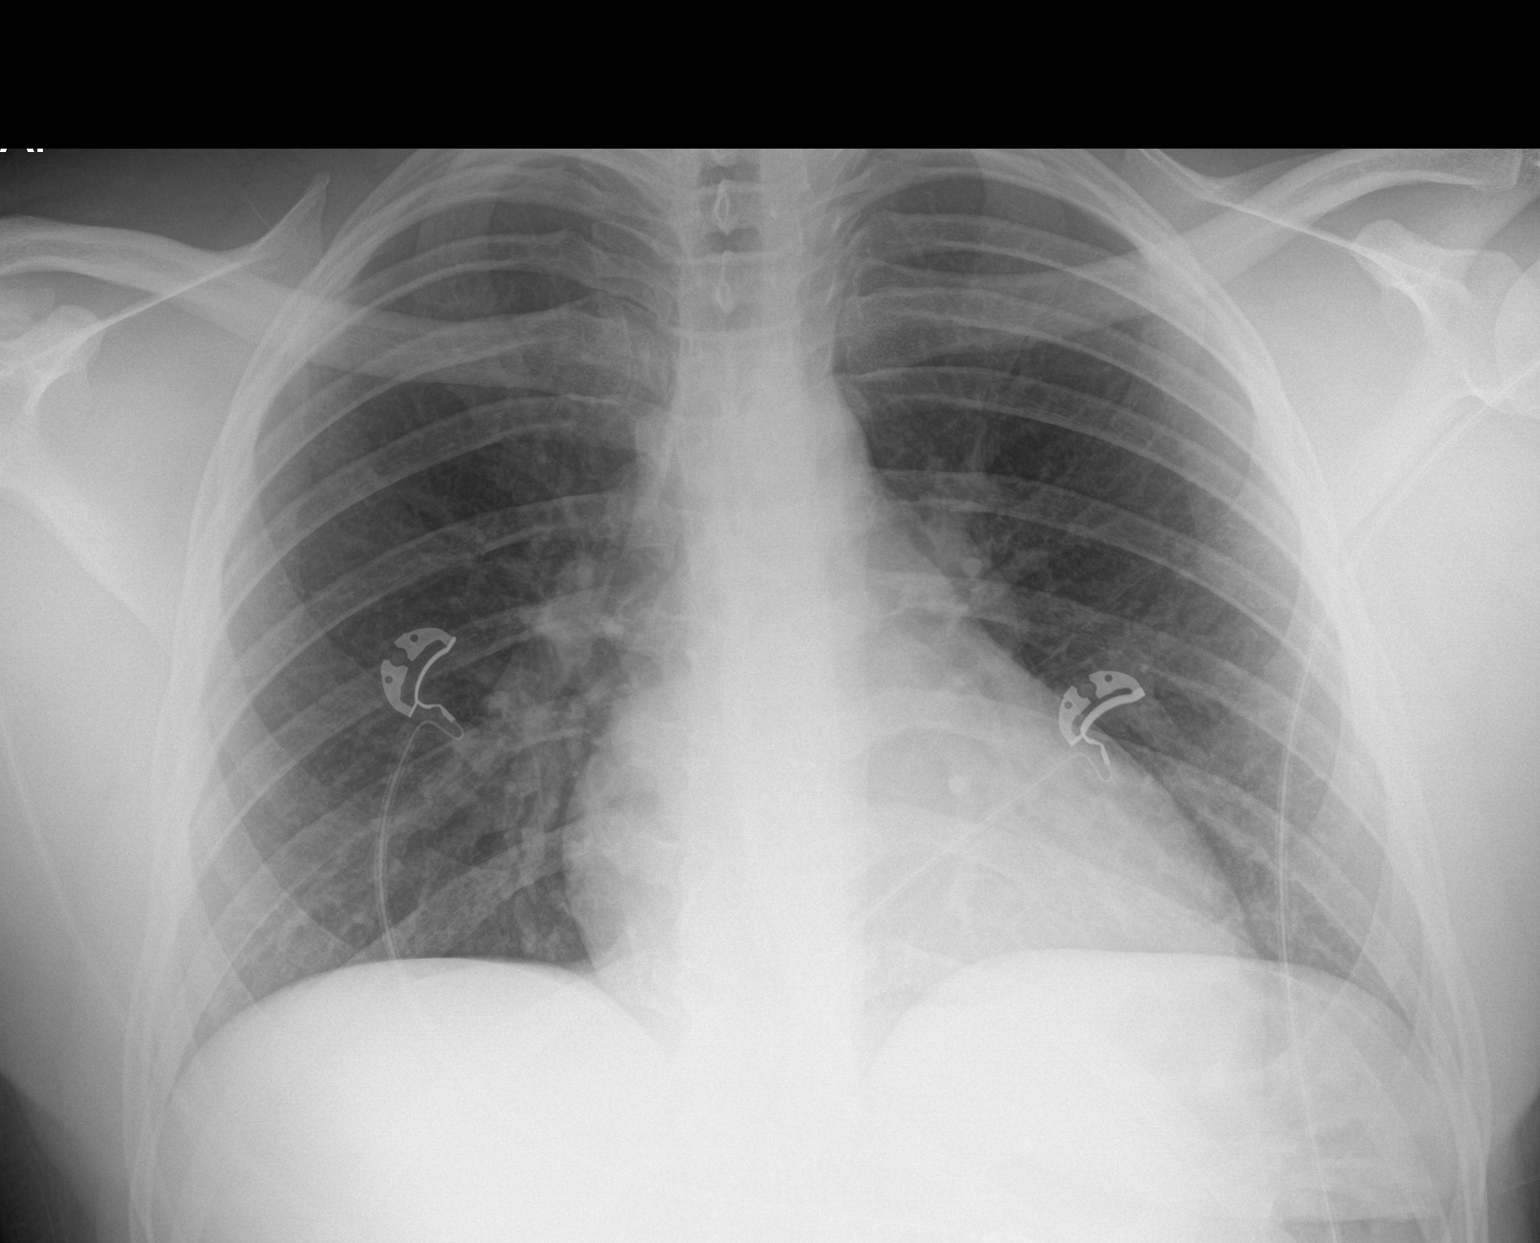

[1 of 1 positions shown; findings below may reference images not displayed]

FINDINGS: The cardiomediastinal contours are normal. The lungs are clear.
Pulmonary vasculature is normal. No consolidation, pleural effusion,
or pneumothorax. No acute osseous abnormalities are seen.
IMPRESSION: No acute chest findings.

## 2021-02-16 ENCOUNTER — Encounter (HOSPITAL_BASED_OUTPATIENT_CLINIC_OR_DEPARTMENT_OTHER): Payer: Self-pay | Admitting: *Deleted

## 2021-02-16 ENCOUNTER — Other Ambulatory Visit: Payer: Self-pay

## 2021-02-16 ENCOUNTER — Emergency Department (HOSPITAL_BASED_OUTPATIENT_CLINIC_OR_DEPARTMENT_OTHER)
Admission: EM | Admit: 2021-02-16 | Discharge: 2021-02-17 | Disposition: A | Discharge status: Home / Self Care | Payer: BC Managed Care – PPO | Attending: Emergency Medicine | Admitting: Emergency Medicine

## 2021-02-16 ENCOUNTER — Emergency Department (HOSPITAL_BASED_OUTPATIENT_CLINIC_OR_DEPARTMENT_OTHER): Payer: BC Managed Care – PPO

## 2021-02-16 DIAGNOSIS — R1012 Left upper quadrant pain: Secondary | ICD-10-CM | POA: Diagnosis not present

## 2021-02-16 DIAGNOSIS — R0602 Shortness of breath: Secondary | ICD-10-CM | POA: Diagnosis not present

## 2021-02-16 DIAGNOSIS — R110 Nausea: Secondary | ICD-10-CM | POA: Diagnosis not present

## 2021-02-16 LAB — CBC WITH DIFFERENTIAL/PLATELET
Abs Immature Granulocytes: 0.03 10*3/uL (ref 0.00–0.07)
Basophils Absolute: 0.1 10*3/uL (ref 0.0–0.1)
Basophils Relative: 1 %
Eosinophils Absolute: 0.2 10*3/uL (ref 0.0–0.5)
Eosinophils Relative: 2 %
HCT: 42.1 % (ref 39.0–52.0)
Hemoglobin: 15.4 g/dL (ref 13.0–17.0)
Immature Granulocytes: 0 %
Lymphocytes Relative: 35 %
Lymphs Abs: 3.5 10*3/uL (ref 0.7–4.0)
MCH: 31.2 pg (ref 26.0–34.0)
MCHC: 36.6 g/dL — ABNORMAL HIGH (ref 30.0–36.0)
MCV: 85.2 fL (ref 80.0–100.0)
Monocytes Absolute: 0.6 10*3/uL (ref 0.1–1.0)
Monocytes Relative: 6 %
Neutro Abs: 5.7 10*3/uL (ref 1.7–7.7)
Neutrophils Relative %: 56 %
Platelets: 301 10*3/uL (ref 150–400)
RBC: 4.94 MIL/uL (ref 4.22–5.81)
RDW: 12.3 % (ref 11.5–15.5)
WBC: 10.1 10*3/uL (ref 4.0–10.5)
nRBC: 0 % (ref 0.0–0.2)

## 2021-02-16 NOTE — ED Provider Notes (Signed)
[facility] EMERGENCY DEPARTMENT Provider Note   CSN: [id number] Arrival date & time: 02/16/21  2256     History Chief Complaint  Patient presents with   Abdominal Pain    Bless Belshe is a 25 y.o. male.  The history is provided by the patient.  Abdominal Pain Pain location:  LUQ Pain quality: sharp   Pain quality comment:  "tender" Pain radiates to:  Chest Pain severity:  Moderate Onset quality:  Gradual Duration:  2 days Timing:  Intermittent Progression:  Worsening Chronicity:  New Relieved by:  None tried Worsened by:  Movement and palpation Associated symptoms: nausea and shortness of breath   Associated symptoms: no constipation, no diarrhea, no dysuria, no fever, no hematemesis, no melena and no vomiting   Patient reports left upper quadrant abdominal pain for the past 2 days.  He reports it has been tender and intermittently sharp and will radiate at times into his chest.  He does feel short of breath with the pain.  No fevers or vomiting.  No trauma or falls.  No cough, no pleuritic pain He denies any other significant medical conditions He does not abuse alcohol or NSAIDs    Past Medical History:  Diagnosis Date   Pleurisy       Social History   Tobacco Use   Smoking status: Never   Smokeless tobacco: Never  Vaping Use   Vaping Use: Never used  Substance Use Topics   Alcohol use: Yes    Comment: occ   Drug use: Yes    Types: Marijuana    Home Medications Prior to Admission medications   Not on File    Allergies    Patient has no known allergies.  Review of Systems   Review of Systems  Constitutional:  Negative for fever.  Respiratory:  Positive for shortness of breath.   Gastrointestinal:  Positive for abdominal pain and nausea. Negative for constipation, diarrhea, hematemesis, melena and vomiting.  Genitourinary:  Negative for dysuria.  All other systems reviewed and are negative.  Physical Exam Updated Vital  Signs BP (!) 140/94 (BP Location: Right Arm)   Pulse 63   Temp 98.2 F (36.8 C) (Oral)   Resp 15   Ht 1.727 m (5\' 8" )   Wt 77.1 kg   SpO2 100%   BMI 25.85 kg/m   Physical Exam CONSTITUTIONAL: Well developed/well nourished, anxious pacing around the room HEAD: Normocephalic/atraumatic EYES: EOMI/PERRL ENMT: Mask in place NECK: supple no meningeal signs SPINE/BACK:entire spine nontender CV: S1/S2 noted, no murmurs/rubs/gallops noted LUNGS: Lungs are clear to auscultation bilaterally, no apparent distress ABDOMEN: soft, mild LUQ tenderness, no rebound or guarding, bowel sounds noted throughout abdomen, no erythema, no rash, no bruising Chest-tenderness along left costal margin, no bruising or crepitus GU:no cva tenderness NEURO: Pt is awake/alert/appropriate, moves all extremitiesx4.  No facial droop.   EXTREMITIES: pulses normal/equal, full ROM SKIN: warm, color normal PSYCH: no abnormalities of mood noted, alert and oriented to situation  ED Results / Procedures / Treatments   Labs (all labs ordered are listed, but only abnormal results are displayed) Labs Reviewed  CBC WITH DIFFERENTIAL/PLATELET - Abnormal; Notable for the following components:      Result Value   MCHC 36.6 (*)    All other components within normal limits  COMPREHENSIVE METABOLIC PANEL - Abnormal; Notable for the following components:   Potassium 3.3 (*)    Glucose, Bld 102 (*)    All other components within normal limits  LIPASE, BLOOD    EKG EKG Interpretation  Date/Time:  Saturday February 16 2021 23:54:02 EST Ventricular Rate:  61 PR Interval:  230 QRS Duration: 91 QT Interval:  398 QTC Calculation: 401 R Axis:   25 Text Interpretation: Sinus rhythm Prolonged PR interval RSR' in V1 or V2, probably normal variant No significant change since last tracing Confirmed by Zadie Rhine ([id number]) on 02/17/2021 12:00:18 AM  Radiology DG Chest 2 View  Result Date: 02/16/2021 CLINICAL DATA:  Left  upper quadrant abdominal pain EXAM: CHEST - 2 VIEW COMPARISON:  04/21/2019 FINDINGS: Lungs are clear.  No pleural effusion or pneumothorax. The heart is normal in size. Visualized osseous structures are within normal limits. IMPRESSION: Normal chest radiographs. Electronically Signed   By: Charline Bills M.D.   On: 02/16/2021 23:53    Procedures Procedures   Medications Ordered in ED Medications - No data to display  ED Course  I have reviewed the triage vital signs and the nursing notes.  Pertinent labs & imaging results that were available during my care of the patient were reviewed by me and considered in my medical decision making (see chart for details).    MDM Rules/Calculators/A&P                           This patient presents to the ED for concern of abdominal pain, this involves an extensive number of treatment options, and is a complaint that carries with it a high risk of complications and morbidity.  The differential diagnosis includes gastritis, ulcer, pancreatitis, acute coronary syndrome, muscle strain, pleurisy   Lab Tests:  I Ordered, reviewed, and interpreted labs, which included electrolytes, complete blood count, liver function panel, lipase  Medicines ordered:  Patient declines pain medicine  Imaging Studies ordered:  I ordered imaging studies which included chest x-ray  I independently visualized and interpreted imaging which showed no acute findings  Additional history obtained:   Previous records obtained and reviewed    Reevaluation:  After the interventions stated above, I reevaluated the patient and found patient is improved   Patient presents with left upper quadrant abdominal pain as well as left lower chest wall pain.  It was reproducible with palpation and movement. Labs and imaging were overall unremarkable Patient is already feeling improved. Suspicion for at this time he is safe for discharge I have low suspicion for acute  abdominal or thoracic emergency Final Clinical Impression(s) / ED Diagnoses Final diagnoses:  Left upper quadrant abdominal pain    Rx / DC Orders ED Discharge Orders     None        Zadie Rhine, MD 02/17/21 [id number]

## 2021-02-16 NOTE — ED Triage Notes (Signed)
LUQ pain x 2 days, tender to touch, sharp pain "comes and goes". States he feels bloated. Endorses nausea

## 2021-02-17 LAB — COMPREHENSIVE METABOLIC PANEL
ALT: 24 U/L (ref 0–44)
AST: 25 U/L (ref 15–41)
Albumin: 4.5 g/dL (ref 3.5–5.0)
Alkaline Phosphatase: 69 U/L (ref 38–126)
Anion gap: 9 (ref 5–15)
BUN: 18 mg/dL (ref 6–20)
CO2: 23 mmol/L (ref 22–32)
Calcium: 9.1 mg/dL (ref 8.9–10.3)
Chloride: 104 mmol/L (ref 98–111)
Creatinine, Ser: 0.99 mg/dL (ref 0.61–1.24)
GFR, Estimated: >60 mL/min (ref >60)
Glucose, Bld: 102 mg/dL — ABNORMAL HIGH (ref 70–99)
Potassium: 3.3 mmol/L — ABNORMAL LOW (ref 3.5–5.1)
Sodium: 136 mmol/L (ref 135–145)
Total Bilirubin: 0.6 mg/dL (ref 0.3–1.2)
Total Protein: 7.6 g/dL (ref 6.5–8.1)

## 2021-02-17 LAB — LIPASE, BLOOD: Lipase: 34 U/L (ref 11–51)

## 2021-02-17 NOTE — Discharge Instructions (Signed)

## 2021-02-17 NOTE — ED Notes (Signed)
Patient discharged to home.  All discharge instructions reviewed.  Patient verbalized understanding via teachback method.  VS WDL.  Respirations even and unlabored.  Ambulatory out of ED.   °

## 2022-06-10 IMAGING — DX DG CHEST 2V
2 series · 2 of 2 positions shown · non-contrast
Comparison: 04/21/2019

CLINICAL DATA: Left upper quadrant abdominal pain

EXAM:
CHEST - 2 VIEW

[chest pa]
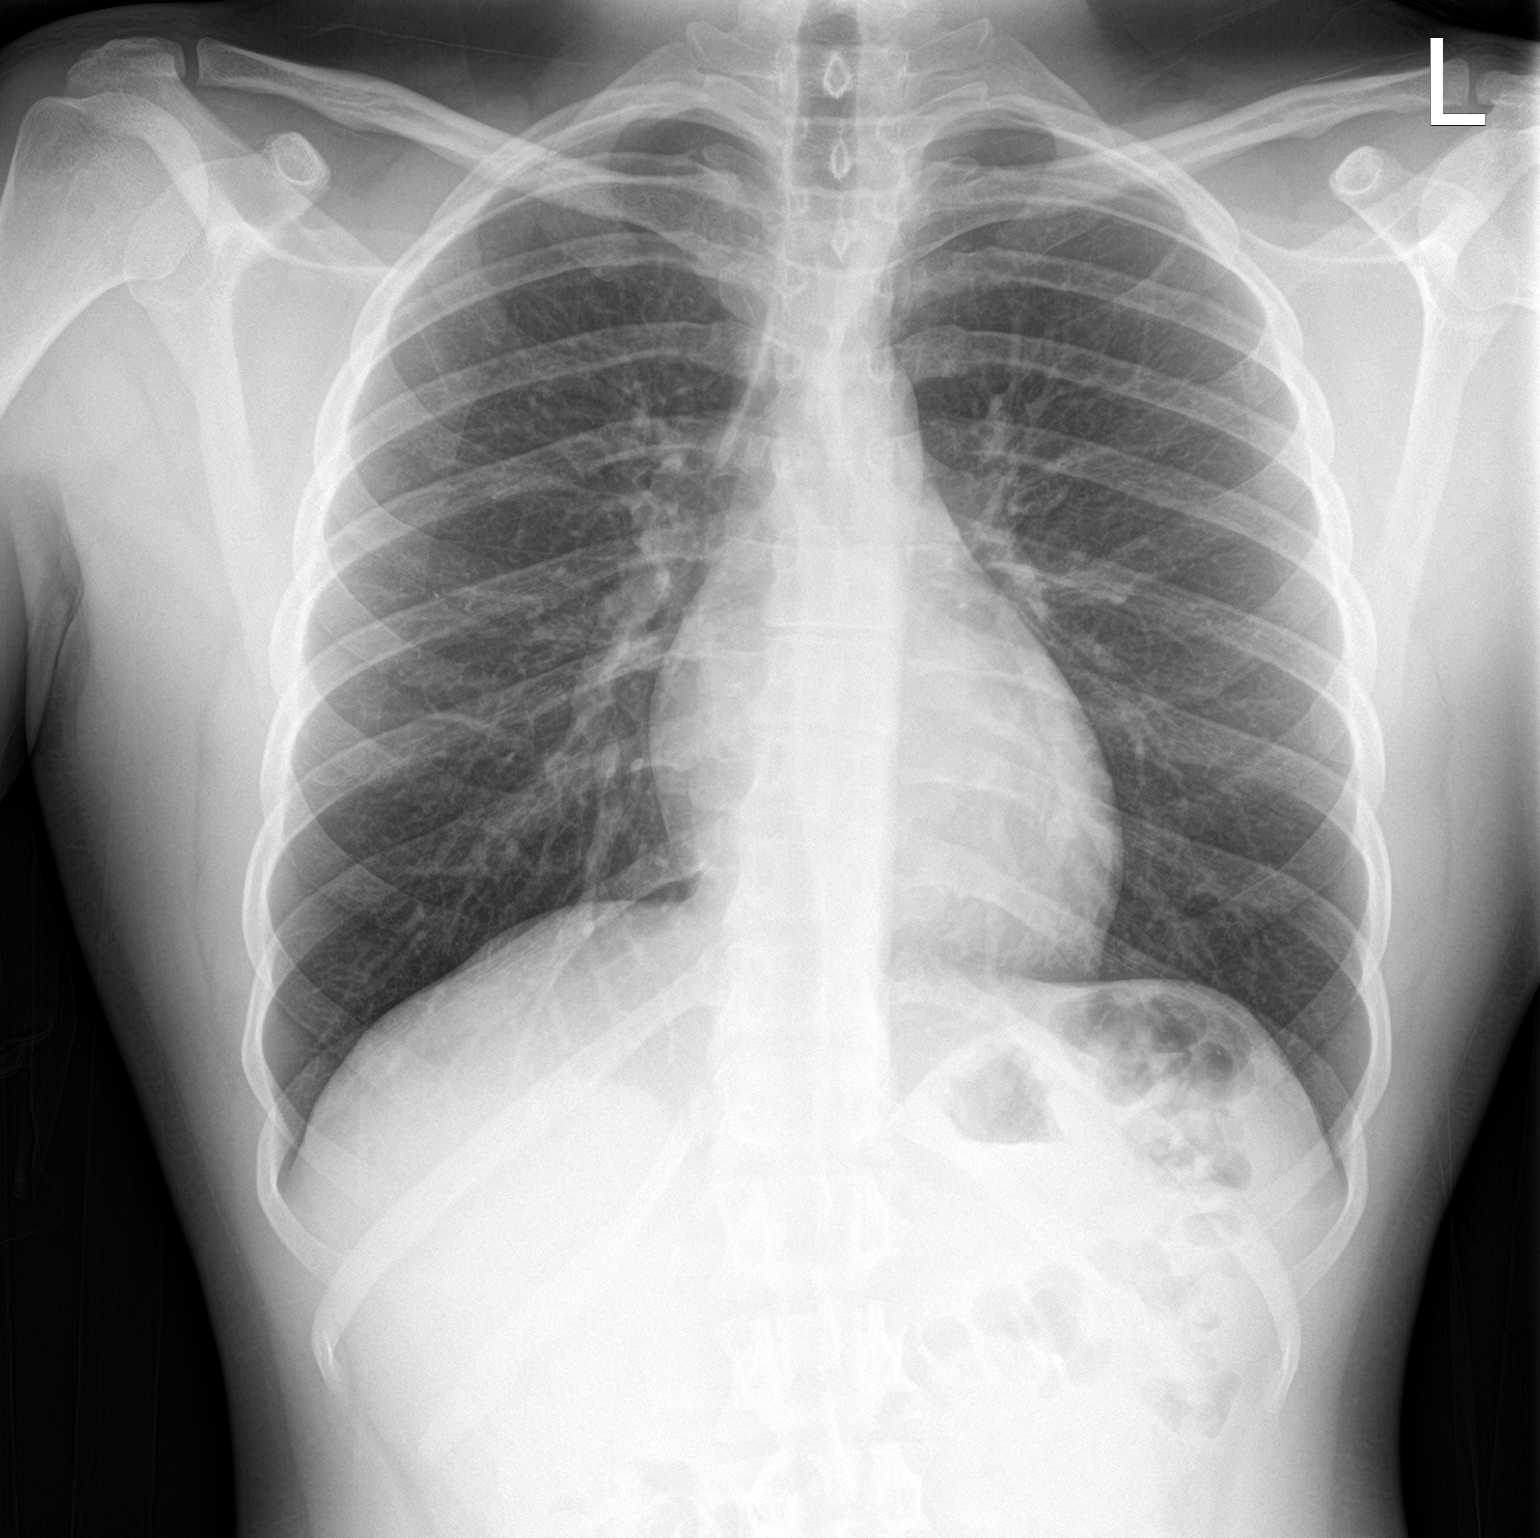

[chest lat]
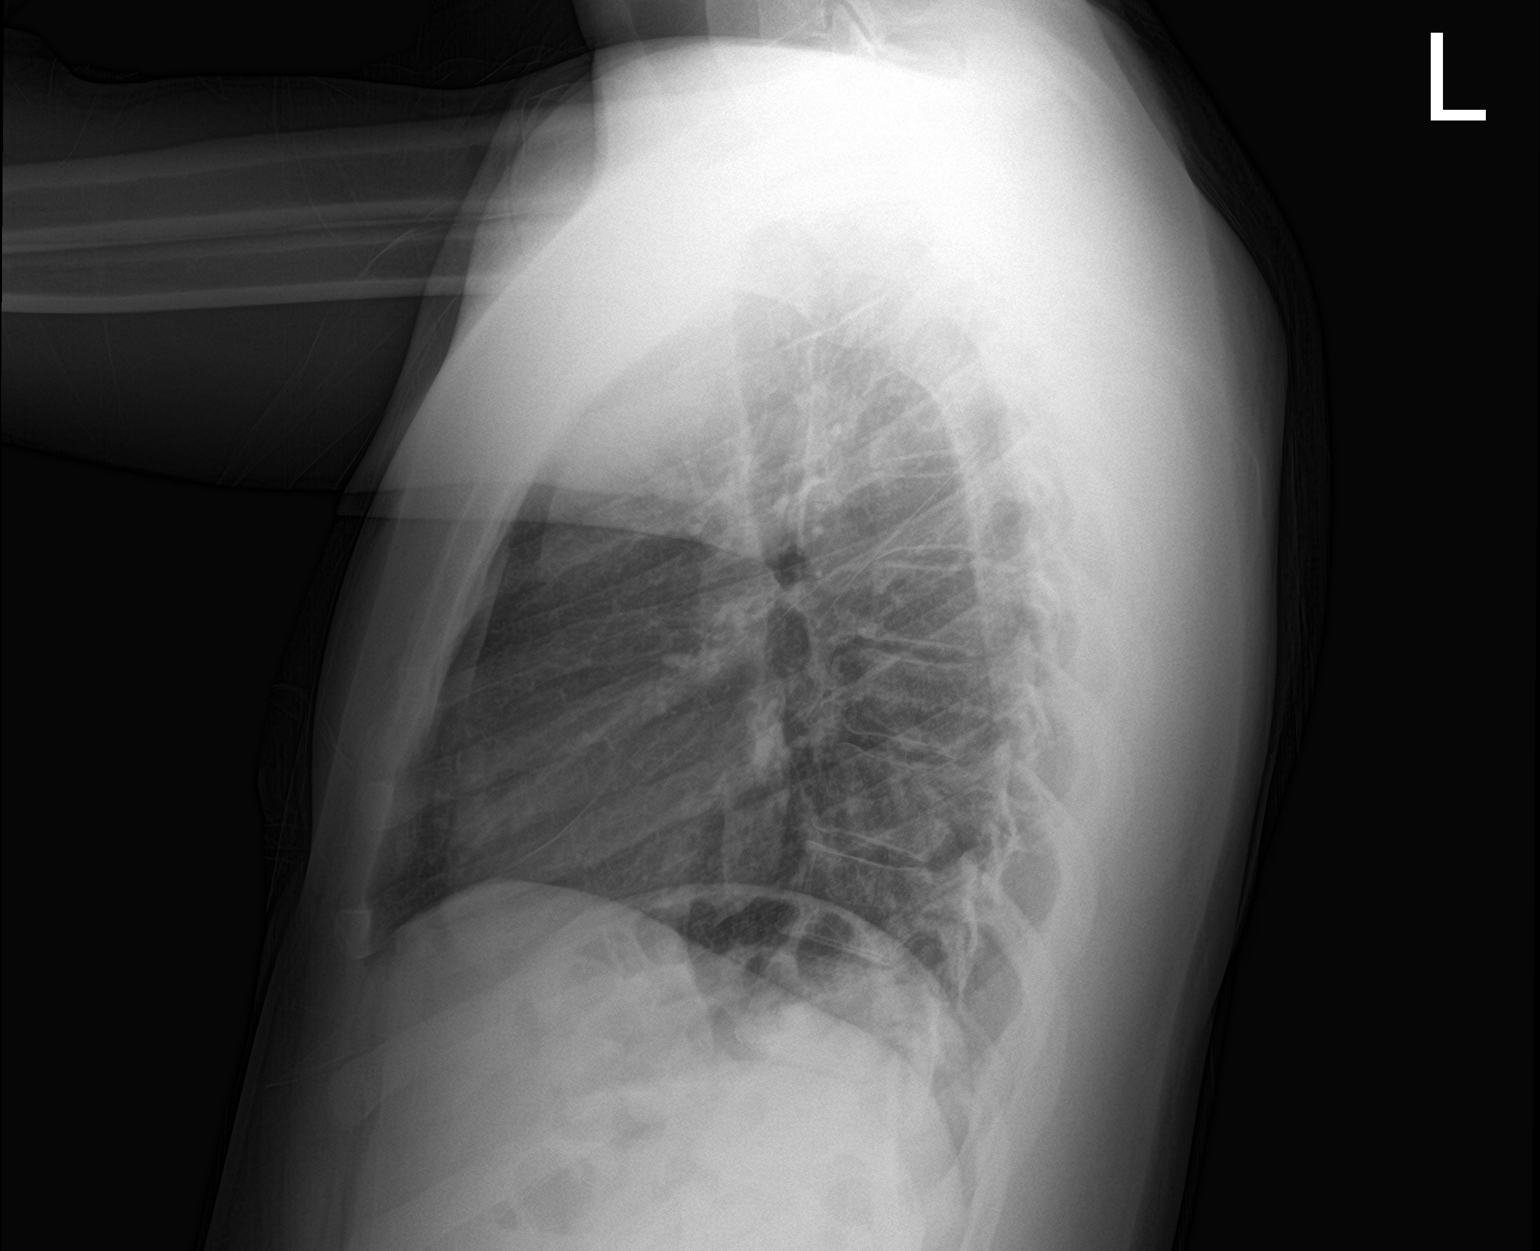

[2 of 2 positions shown; findings below may reference images not displayed]

FINDINGS: Lungs are clear.  No pleural effusion or pneumothorax.

The heart is normal in size.

Visualized osseous structures are within normal limits.
IMPRESSION: Normal chest radiographs.
